# Patient Record
Sex: Female | Born: 1965 | Race: Black or African American | Hispanic: No | Marital: Married | Smoking: Never smoker
Health system: Southern US, Community
[De-identification: ages and names within clinical notes are randomized; demographics above are authoritative.]

---

## 2018-04-22 ENCOUNTER — Emergency Department (HOSPITAL_COMMUNITY): Payer: BLUE CROSS/BLUE SHIELD

## 2018-04-22 ENCOUNTER — Encounter (HOSPITAL_COMMUNITY): Payer: Self-pay

## 2018-04-22 ENCOUNTER — Emergency Department (HOSPITAL_COMMUNITY)
Admission: EM | Admit: 2018-04-22 | Discharge: 2018-04-22 | Disposition: A | Discharge status: Home / Self Care | Payer: BLUE CROSS/BLUE SHIELD | Attending: Emergency Medicine | Admitting: Emergency Medicine

## 2018-04-22 DIAGNOSIS — R519 Headache, unspecified: Secondary | ICD-10-CM

## 2018-04-22 DIAGNOSIS — R51 Headache: Secondary | ICD-10-CM | POA: Insufficient documentation

## 2018-04-22 DIAGNOSIS — I160 Hypertensive urgency: Secondary | ICD-10-CM | POA: Diagnosis not present

## 2018-04-22 LAB — BASIC METABOLIC PANEL
ANION GAP: 13 (ref 5–15)
BUN: 14 mg/dL (ref 6–20)
CALCIUM: 9.1 mg/dL (ref 8.9–10.3)
CO2: 23 mmol/L (ref 22–32)
Chloride: 103 mmol/L (ref 98–111)
Creatinine, Ser: 0.9 mg/dL (ref 0.44–1.00)
GFR calc Af Amer: >60 mL/min (ref >60)
Glucose, Bld: 145 mg/dL — ABNORMAL HIGH (ref 70–99)
POTASSIUM: 3.7 mmol/L (ref 3.5–5.1)
Sodium: 139 mmol/L (ref 135–145)

## 2018-04-22 LAB — CBC
HEMATOCRIT: 40.4 % (ref 36.0–46.0)
HEMOGLOBIN: 12.9 g/dL (ref 12.0–15.0)
MCH: 29.3 pg (ref 26.0–34.0)
MCHC: 31.9 g/dL (ref 30.0–36.0)
MCV: 91.8 fL (ref 78.0–100.0)
Platelets: 206 10*3/uL (ref 150–400)
RBC: 4.4 MIL/uL (ref 3.87–5.11)
RDW: 13.2 % (ref 11.5–15.5)
WBC: 6.1 10*3/uL (ref 4.0–10.5)

## 2018-04-22 LAB — I-STAT TROPONIN, ED: Troponin i, poc: 0 ng/mL (ref 0.00–0.08)

## 2018-04-22 MED ORDER — MAGNESIUM SULFATE 2 GM/50ML IV SOLN
2.0000 g | Freq: Once | INTRAVENOUS | Status: AC
  Administered 2018-04-22: 2 g via INTRAVENOUS
  Filled 2018-04-22: qty 50

## 2018-04-22 MED ORDER — DIPHENHYDRAMINE HCL 50 MG/ML IJ SOLN
25.0000 mg | Freq: Once | INTRAMUSCULAR | Status: AC
  Administered 2018-04-22: 25 mg via INTRAVENOUS
  Filled 2018-04-22: qty 1

## 2018-04-22 MED ORDER — PROCHLORPERAZINE EDISYLATE 10 MG/2ML IJ SOLN
10.0000 mg | Freq: Once | INTRAMUSCULAR | Status: AC
  Administered 2018-04-22: 10 mg via INTRAVENOUS
  Filled 2018-04-22: qty 2

## 2018-04-22 NOTE — ED Notes (Signed)
Delay in lab draw,  Pt not in room 

## 2018-04-22 NOTE — ED Triage Notes (Addendum)
Patient BIB [facility] for headache and HTN. Patient states she was at church when she all of the sudden felt dizzy and her head started hurting behind both eyes. Describes pain as constant 10/10. Also reports sensitivity to light. Denies chest pain, SOB, abdominal pain, or vomiting. BP on EMS arrival 200/140, patient states her PCP is monitoring and has mentioned putting her on BP meds but hasn't done so yet. Patient is from out of town visiting with her church.   BP 200/140 HR 136 RR 20

## 2018-04-22 NOTE — ED Notes (Signed)
Reviewed d/c instructions with pt, who verbalized understanding and had no outstanding questions. Pt departed in NAD, refused use of wheelchair.   

## 2018-04-22 NOTE — ED Provider Notes (Signed)
[facility] EMERGENCY DEPARTMENT Provider Note   CSN: [id number] Arrival date & time: 04/22/18  1930     History   Chief Complaint Chief Complaint  Patient presents with  . Headache    HPI Connie Bruce is a 52 y.o. female.  52 year old female with past medical history including obesity who presents with headache and high blood pressure.  This evening just prior to arrival, the patient was at church getting ready to sing in the choir when she had a sudden onset of dizziness and nausea.  Around the same time she had a gradual onset of frontal headache behind her eyes.  These symptoms waned and she went on to sing but then later she began having the same nausea and dizziness again.  Her headache gradually worsened and is now severe and constant, with associated photophobia.  She had some retching at church and EMS was called.  She denies any chest pain, shortness of breath, fevers, or recent illness.  No focal numbness/weakness. She notes a history of borderline blood pressure that her PCP has been watching and has said may require medication in the future.  She reports family history of hypertension and heart disease.  The history is provided by the patient.  Headache      History reviewed. No pertinent past medical history.  There are no active problems to display for this patient.   History reviewed. No pertinent surgical history.   OB History   None      Home Medications    Prior to Admission medications   Not on File    Family History No family history on file.  Social History Social History   Tobacco Use  . Smoking status: Never Smoker  . Smokeless tobacco: Never Used  Substance Use Topics  . Alcohol use: Never    Frequency: Never  . Drug use: Never     Allergies   Patient has no known allergies.   Review of Systems Review of Systems  Neurological: Positive for headaches.   All other systems reviewed and are negative except  that which was mentioned in HPI   Physical Exam Updated Vital Signs BP (!) 185/119   Pulse 93   Temp 98 F (36.7 C) (Oral)   Resp (!) 21   Ht 5\' 9"  (1.753 m)   Wt 108.9 kg   SpO2 99%   BMI 35.44 kg/m   Physical Exam  Constitutional: She is oriented to person, place, and time. She appears well-developed and well-nourished. No distress.  Awake, alert Eyes closed, uncomfortable  HENT:  Head: Normocephalic and atraumatic.  Eyes: Pupils are equal, round, and reactive to light. Conjunctivae and EOM are normal.  Neck: Neck supple.  Cardiovascular: Normal rate, regular rhythm and normal heart sounds.  No murmur heard. Pulmonary/Chest: Effort normal and breath sounds normal. No respiratory distress.  Abdominal: Soft. Bowel sounds are normal. She exhibits no distension. There is no tenderness.  Musculoskeletal: She exhibits no edema.  Neurological: She is alert and oriented to person, place, and time. She has normal strength and normal reflexes. She displays normal reflexes. No cranial nerve deficit. She exhibits normal muscle tone.  Fluent speech, no clonus 5/5 strength and normal sensation x all 4 extremities  Skin: Skin is warm and dry.  Psychiatric: She has a normal mood and affect. Judgment and thought content normal.  Nursing note and vitals reviewed.    ED Treatments / Results  Labs (all labs ordered are listed, but  only abnormal results are displayed) Labs Reviewed  BASIC METABOLIC PANEL - Abnormal; Notable for the following components:      Result Value   Glucose, Bld 145 (*)    All other components within normal limits  CBC  I-STAT TROPONIN, ED    EKG EKG Interpretation  Date/Time:  Sunday April 22 2018 19:40:31 EDT Ventricular Rate:  98 PR Interval:    QRS Duration: 71 QT Interval:  356 QTC Calculation: 455 R Axis:   61 Text Interpretation:  Sinus rhythm Prolonged PR interval Probable left atrial enlargement Probable anteroseptal infarct, old No  previous ECGs available Confirmed by Frederick Peers [id number]) on 04/22/2018 9:13:09 PM   Radiology Ct Head Wo Contrast  Result Date: 04/22/2018 CLINICAL DATA:  Headache and hypertension. Dizziness. Severe pain behind the eyes. Light sensitivity. EXAM: CT HEAD WITHOUT CONTRAST TECHNIQUE: Contiguous axial images were obtained from the base of the skull through the vertex without intravenous contrast. COMPARISON:  None. FINDINGS: Brain: Normal appearing cerebral hemispheres and posterior fossa structures. Normal size and position of the ventricles. Prominent perivascular space in the right basal ganglia. No intracranial hemorrhage, mass lesion or CT evidence of acute infarction. Vascular: No hyperdense vessel or unexpected calcification. Skull: Normal. Negative for fracture or focal lesion. Sinuses/Orbits: Unremarkable. Other: None. IMPRESSION: Normal examination. Electronically Signed   By: Beckie Salts M.D.   On: 04/22/2018 21:26    Procedures Procedures (including critical care time)  Medications Ordered in ED Medications  diphenhydrAMINE (BENADRYL) injection 25 mg (25 mg Intravenous Given 04/22/18 2121)  prochlorperazine (COMPAZINE) injection 10 mg (10 mg Intravenous Given 04/22/18 2121)  magnesium sulfate IVPB 2 g 50 mL (2 g Intravenous New Bag/Given 04/22/18 2121)     Initial Impression / Assessment and Plan / ED Course  I have reviewed the triage vital signs and the nursing notes.  Pertinent labs & imaging results that were available during my care of the patient were reviewed by me and considered in my medical decision making (see chart for details).     Pt neuro intact on exam, BP 176/121. Obtained EKG, trop, labs to evaluate for evidence of hypertensive emergency. Obtained head CT and gave migraine cocktail.  CT negative for acute process.  Head CT obtained within 6 hours of onset of headache is negative making SAH unlikely, especially given report of gradual onset of headache.  Lab work  shows normal troponin, normal CBC and BMP including normal creatinine.  No evidence of hypertensive emergency.  EKG reassuring.  On reassessment, the patient states that her headache is much better and she wants to go home.  She remains hypertensive but systolic is mildly improved and given that she has history of elevated blood pressures according to her PCP, I feel it is appropriate to have her follow-up with her outpatient provider within the next 24 hours to initiate treatment.  She states she would prefer this as opposed to a prescription for medication tonight.  I have extensively reviewed return precautions with her including any sudden onset of severe headache, neurologic symptoms, chest pain, breathing problems, or vision changes.  She voiced understanding.  Final Clinical Impressions(s) / ED Diagnoses   Final diagnoses:  Hypertensive urgency  Acute nonintractable headache, unspecified headache type    ED Discharge Orders    None       Little, Ambrose Finland, MD 04/22/18 2246

## 2019-05-06 IMAGING — CT CT HEAD W/O CM
3 series · 14 of 47 positions shown, 16 images · non-contrast
Comparison: None.

CLINICAL DATA: Headache and hypertension. Dizziness. Severe pain
behind the eyes. Light sensitivity.

EXAM:
CT HEAD WITHOUT CONTRAST
TECHNIQUE: Contiguous axial images were obtained from the base of the skull
through the vertex without intravenous contrast.

[Series 3: head 5.0 h30s · axial · 0.45mm/px · z∈[-94,+36]mm · 8 of 32 slices shown, 10 images]
[im 3/32  brain]
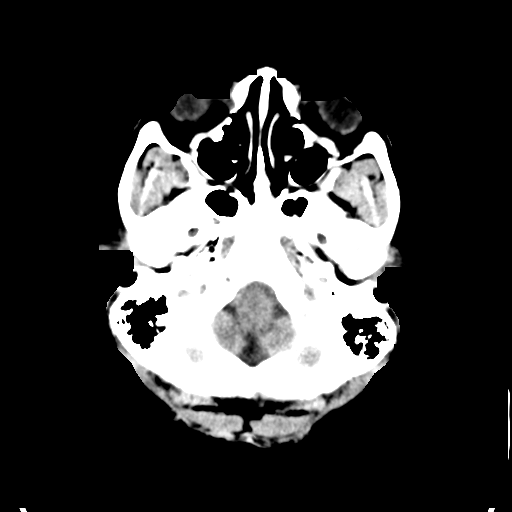
[im 3/32  bone]
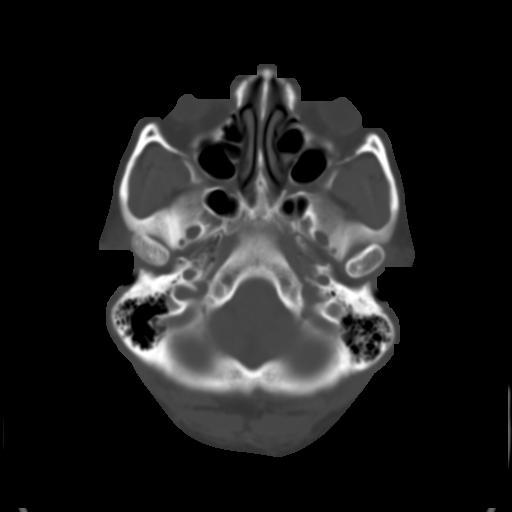
[im 7/32  brain]
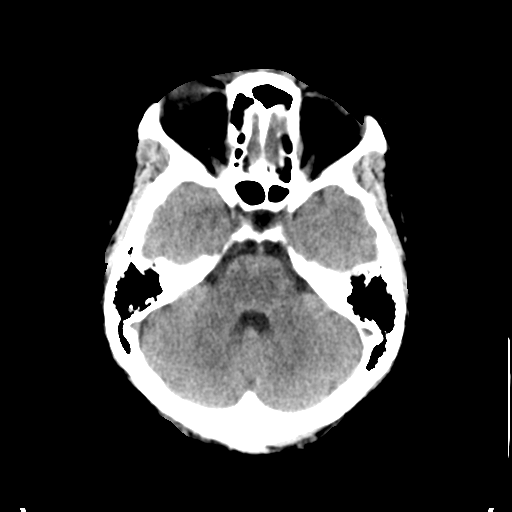
[im 10/32  brain]
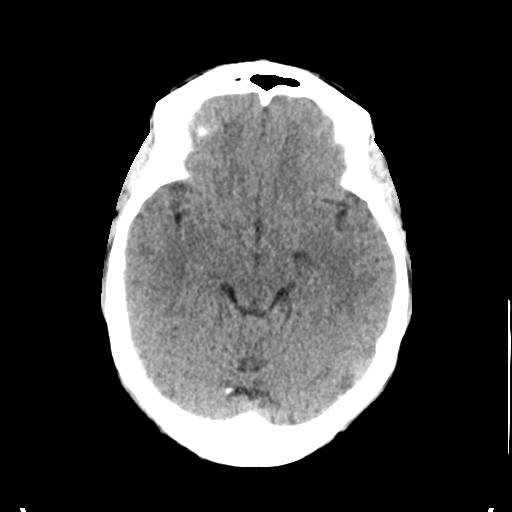
[im 14/32  brain]
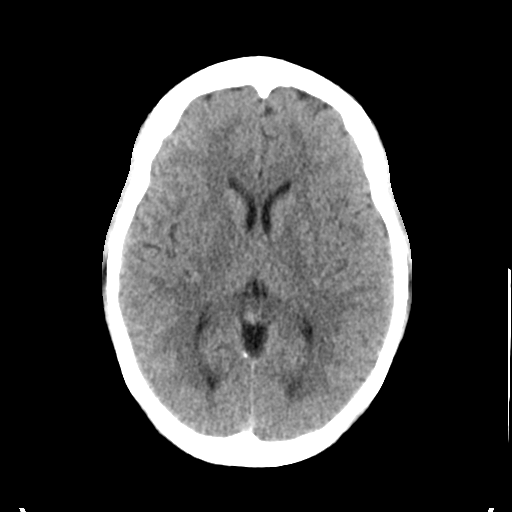
[im 18/32  brain]
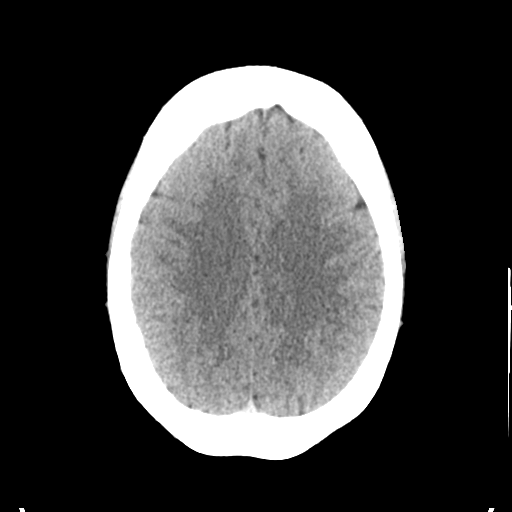
[im 18/32  bone]
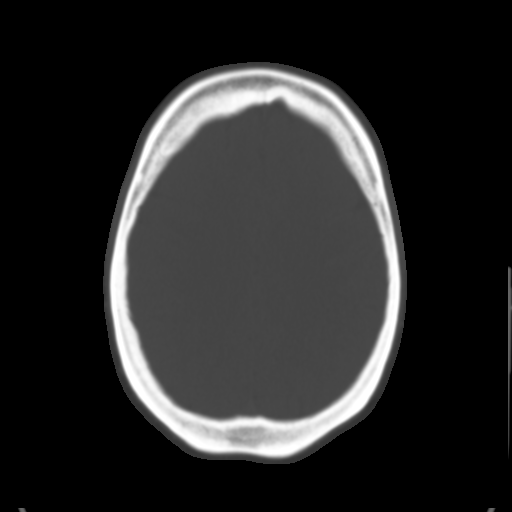
[im 22/32  brain]
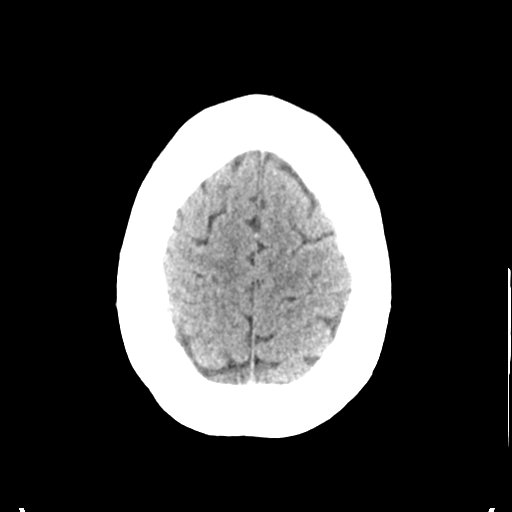
[im 25/32  brain]
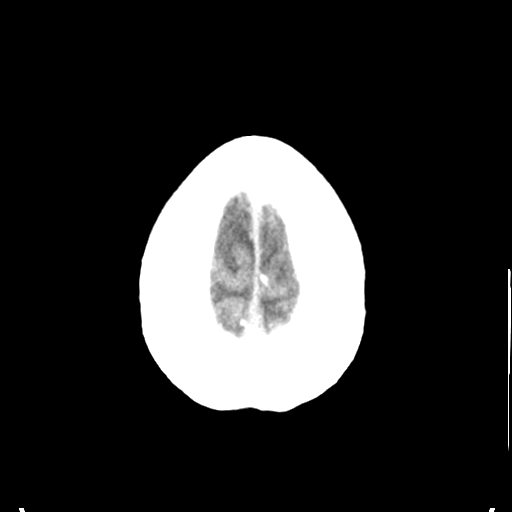
[im 29/32  brain]
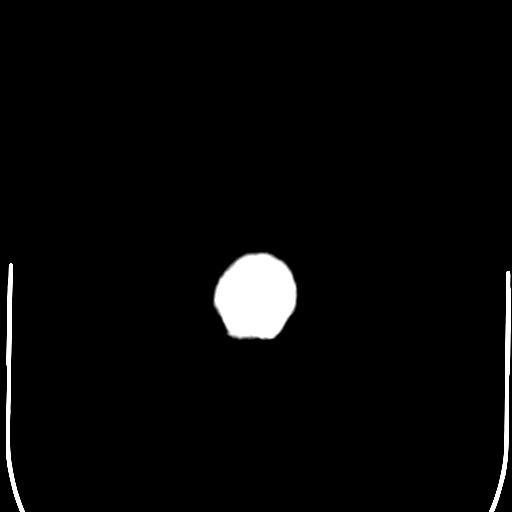

[Series 5: head 3.0 mpr cor · coronal · 0.30mm/px · 3 of 67 slices shown]
[im 23/67  brain]
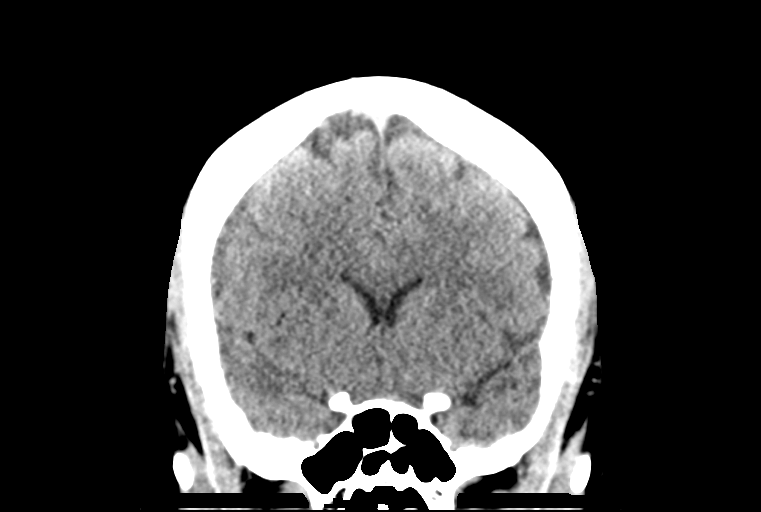
[im 30/67  brain]
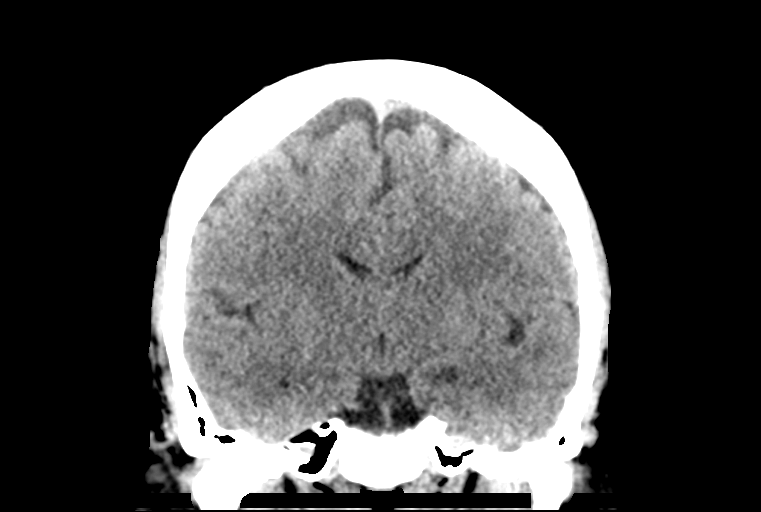
[im 37/67  brain]
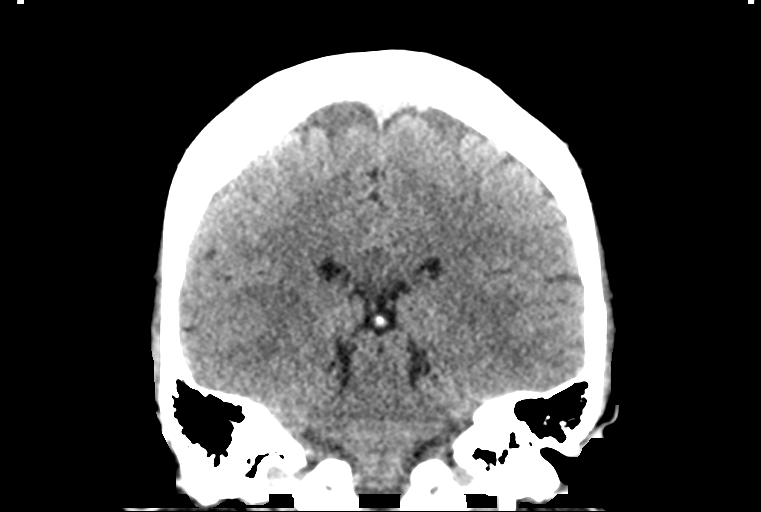

[Series 6: head 3.0 mpr sag · sagittal · 0.30mm/px · 3 of 65 slices shown]
[im 22/65  brain]
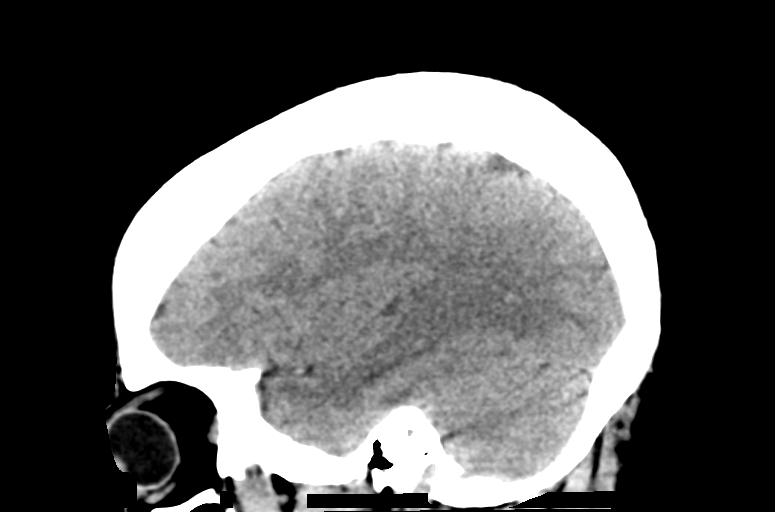
[im 33/65  brain]
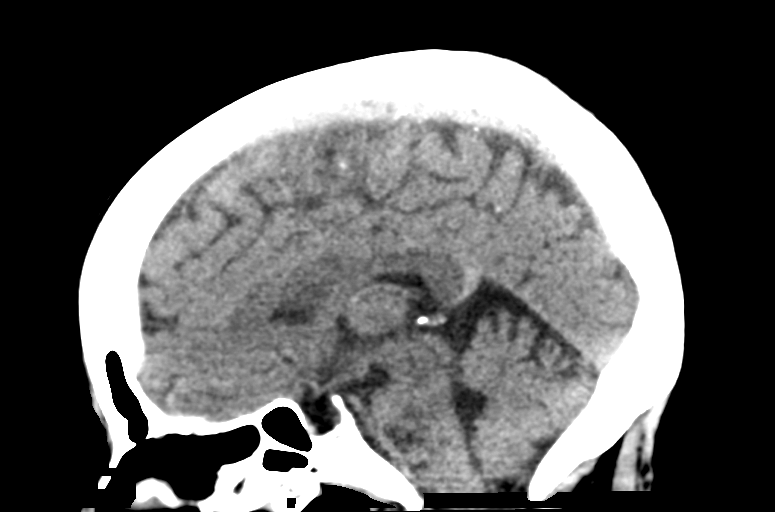
[im 43/65  brain]
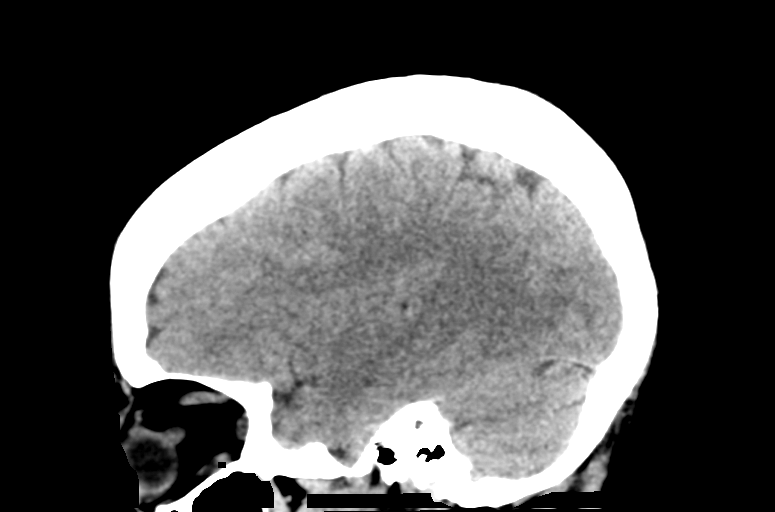

[14 of 47 positions shown; findings below may reference images not displayed]

FINDINGS: Brain: Normal appearing cerebral hemispheres and posterior fossa
structures. Normal size and position of the ventricles. Prominent
perivascular space in the right basal ganglia. No intracranial
hemorrhage, mass lesion or CT evidence of acute infarction.

Vascular: No hyperdense vessel or unexpected calcification.

Skull: Normal. Negative for fracture or focal lesion.

Sinuses/Orbits: Unremarkable.

Other: None.
IMPRESSION: Normal examination.
